# Patient Record
Sex: Male | Born: 1975 | Hispanic: Yes | Marital: Married | Smoking: Never smoker
Health system: Southern US, Community
[De-identification: ages and names within clinical notes are randomized; demographics above are authoritative.]

---

## 2013-03-29 ENCOUNTER — Emergency Department: Payer: Self-pay | Admitting: Emergency Medicine

## 2013-03-29 LAB — BASIC METABOLIC PANEL
ANION GAP: 6 — AB (ref 7–16)
BUN: 17 mg/dL (ref 7–18)
CREATININE: 1.06 mg/dL (ref 0.60–1.30)
Calcium, Total: 8.8 mg/dL (ref 8.5–10.1)
Chloride: 106 mmol/L (ref 98–107)
Co2: 29 mmol/L (ref 21–32)
GLUCOSE: 87 mg/dL (ref 65–99)
Osmolality: 282 (ref 275–301)
Potassium: 3.9 mmol/L (ref 3.5–5.1)
SODIUM: 141 mmol/L (ref 136–145)

## 2013-03-29 LAB — CBC
HCT: 42.1 % (ref 40.0–52.0)
HGB: 14.5 g/dL (ref 13.0–18.0)
MCH: 29.9 pg (ref 26.0–34.0)
MCHC: 34.4 g/dL (ref 32.0–36.0)
MCV: 87 fL (ref 80–100)
PLATELETS: 171 10*3/uL (ref 150–440)
RBC: 4.84 10*6/uL (ref 4.40–5.90)
RDW: 12.9 % (ref 11.5–14.5)
WBC: 9.4 10*3/uL (ref 3.8–10.6)

## 2013-03-29 LAB — TROPONIN I: Troponin-I: 0.03 ng/mL

## 2013-03-31 LAB — BETA STREP CULTURE(ARMC)

## 2013-11-04 ENCOUNTER — Emergency Department: Payer: Self-pay | Admitting: Emergency Medicine

## 2015-01-09 IMAGING — CR DG CHEST 2V
1 series · 2 of 2 positions shown · non-contrast
Comparison: None.

CLINICAL DATA: Chest pain.

EXAM:
CHEST  2 VIEW

[Series 1: pa · 0.17mm/px · 2 of 2 slices shown]
[im 1/2]
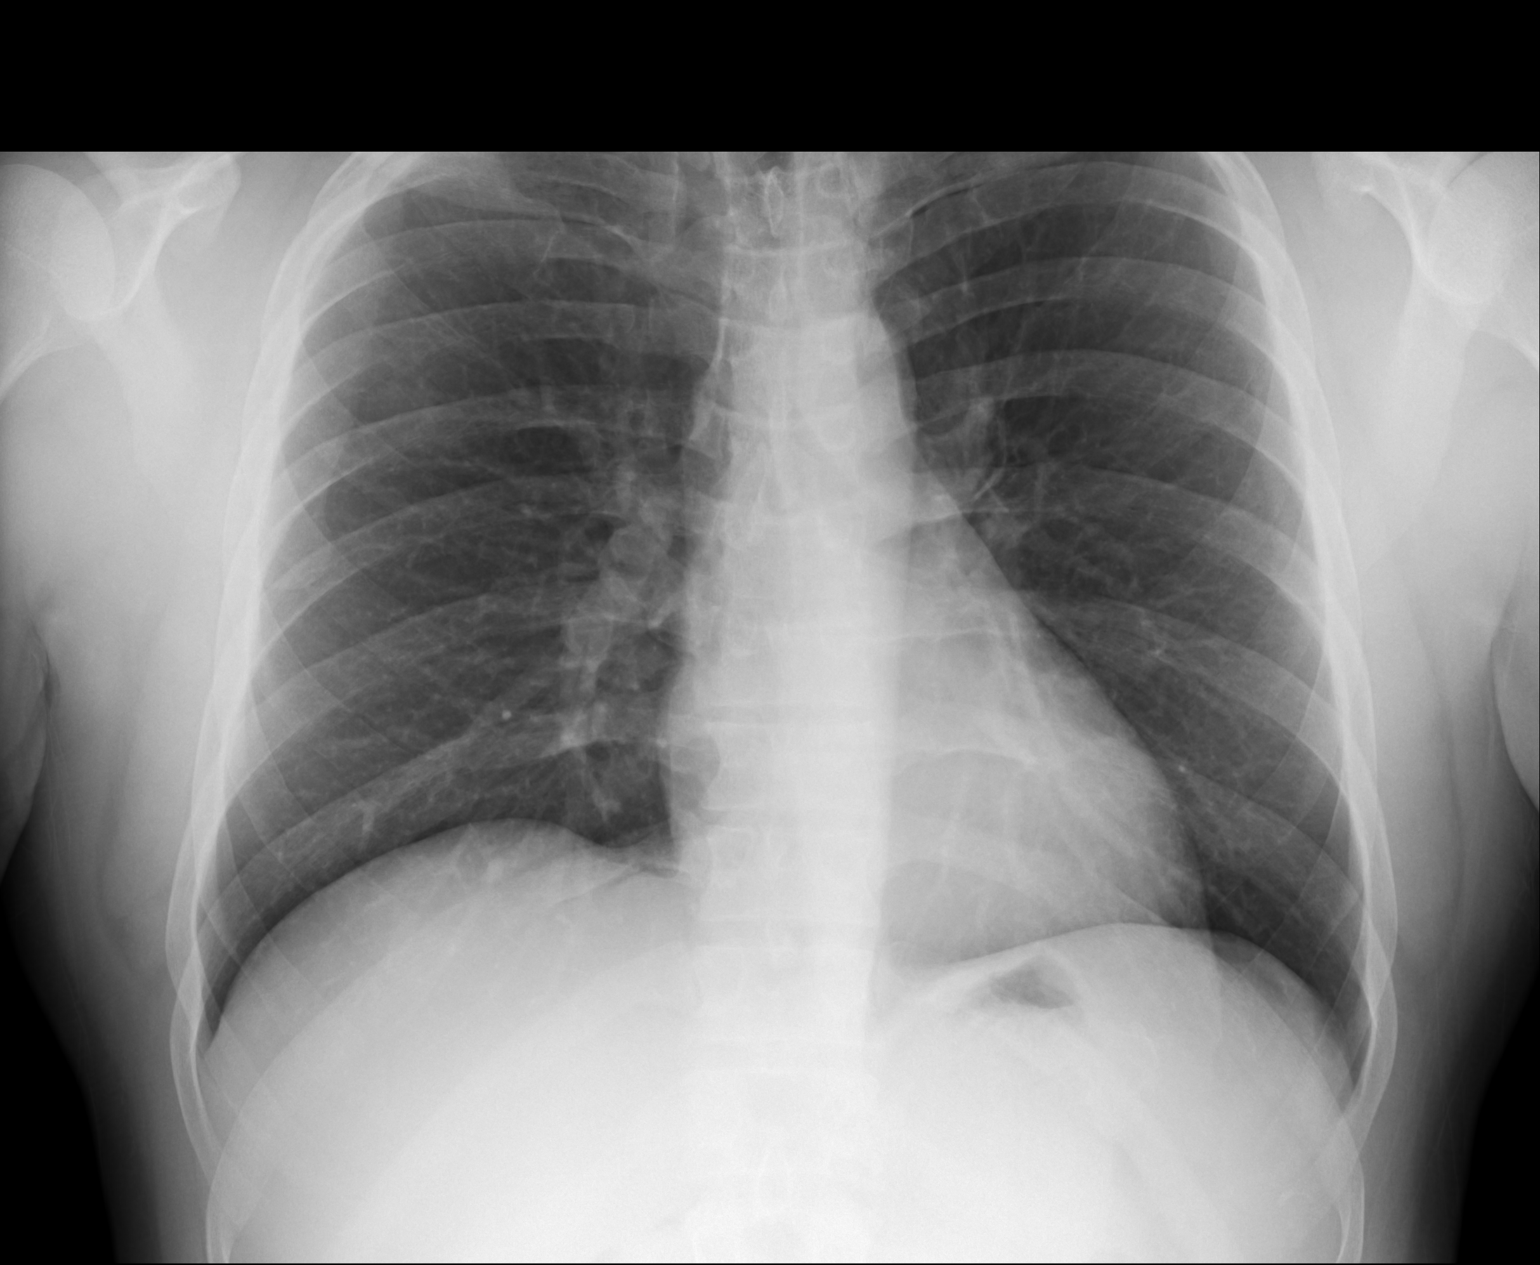
[im 2/2]
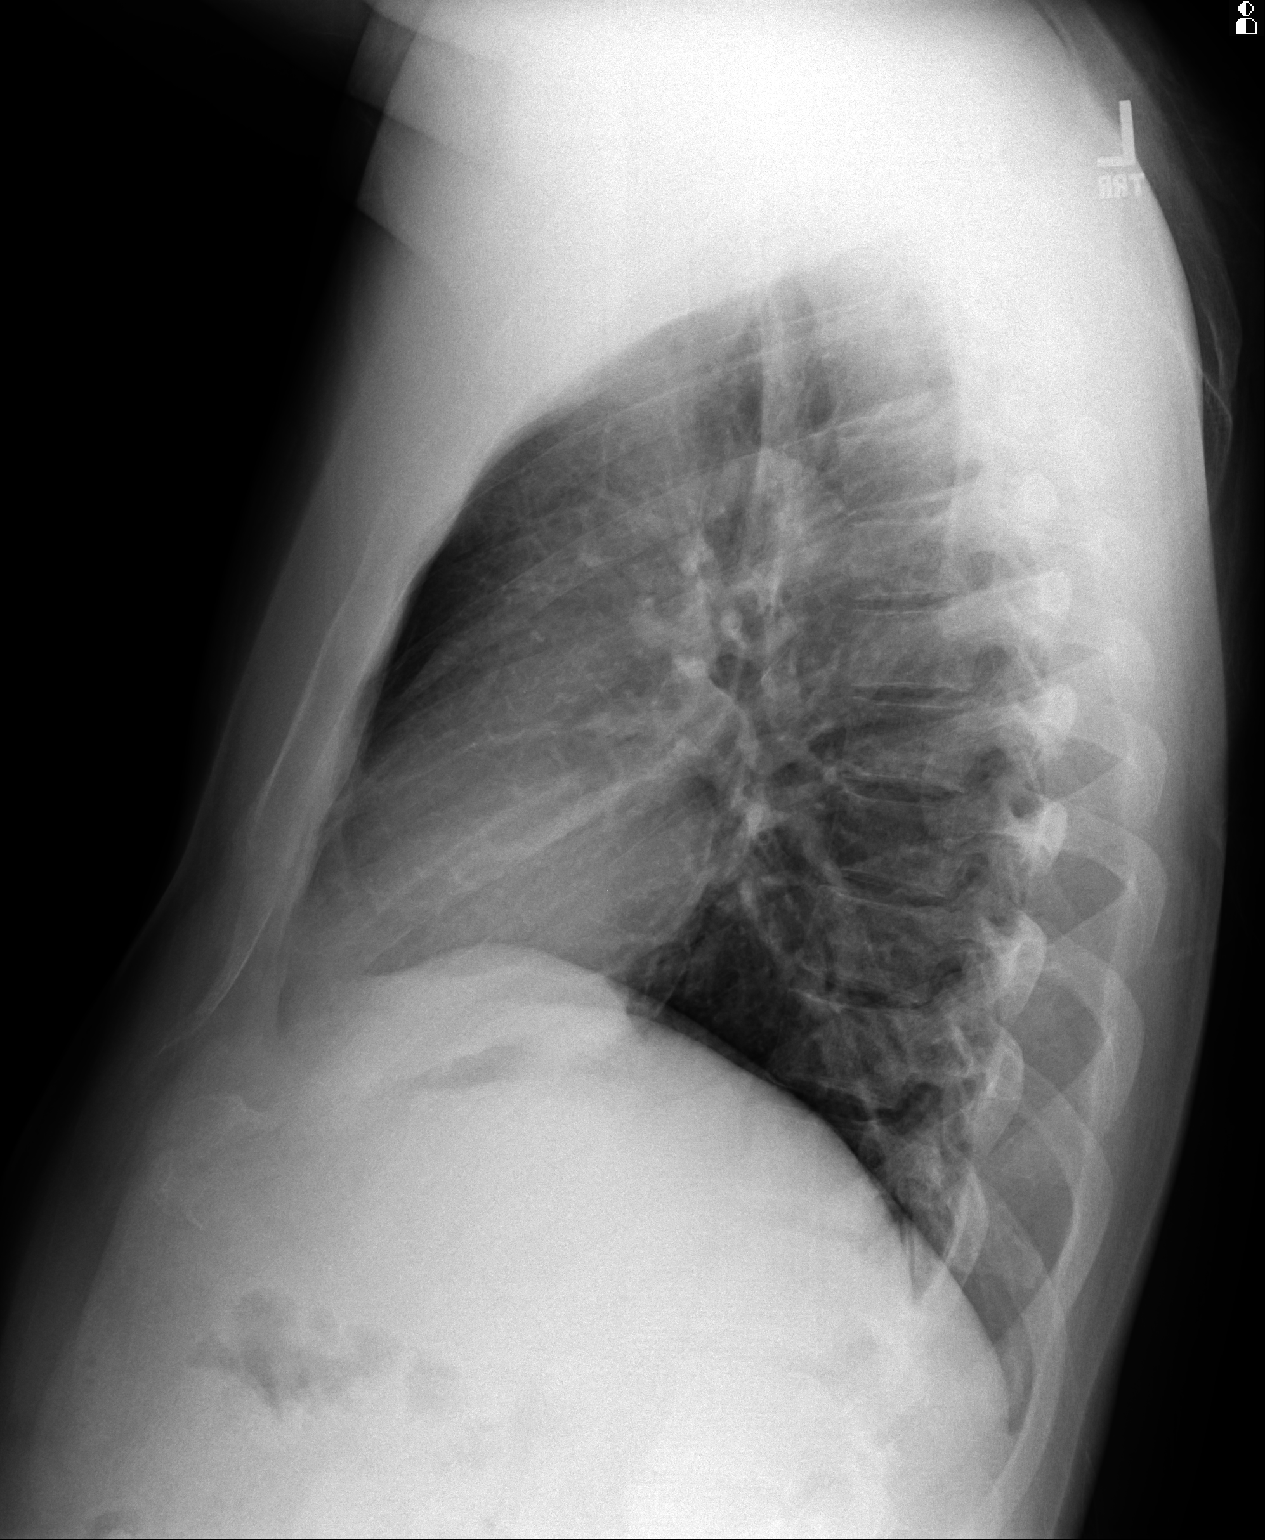

[2 of 2 positions shown; findings below may reference images not displayed]

FINDINGS: Lungs clear. Heart size normal. No pneumothorax or pleural fluid. No
focal bony abnormality.
IMPRESSION: No acute disease.

## 2021-12-20 ENCOUNTER — Emergency Department
Admission: EM | Admit: 2021-12-20 | Discharge: 2021-12-20 | Disposition: A | Discharge status: Home / Self Care | Payer: Self-pay | Attending: Emergency Medicine | Admitting: Emergency Medicine

## 2021-12-20 DIAGNOSIS — M545 Low back pain, unspecified: Secondary | ICD-10-CM | POA: Insufficient documentation

## 2021-12-20 MED ORDER — PREDNISONE 10 MG (21) PO TBPK: ORAL_TABLET | ORAL | 0 refills | Status: DC

## 2021-12-20 MED ORDER — BACLOFEN 10 MG PO TABS: 10.0000 mg | ORAL_TABLET | Freq: Three times a day (TID) | ORAL | 0 refills | Status: AC

## 2021-12-20 NOTE — ED Triage Notes (Signed)
Pt presents to the ED via POV due to back pain x2 weeks. Pt states he was brushing his teeth when he felt the initial sharp pain. Pt attempted pain management at home but unsuccessful. Pt states he is having difficulty completing his ADLs. Denies NVD. Pt A&Ox4

## 2021-12-20 NOTE — ED Provider Notes (Signed)
[facility] Provider Note    Event Date/Time   First MD Initiated Contact with Patient 12/20/21 1759     (approximate)   History   Back Pain   HPI  Douglas Logan is a 46 y.o. male with no significant past medical history presents emergency department with back pain for 2 weeks.  No numbness or tingling.  Patient states pain radiates from lower back around to the sides.  Worse with movement.  States he cannot bend over to tie his shoes.  No known injury.      Physical Exam   Triage Vital Signs: ED Triage Vitals  Enc Vitals Group     BP 12/20/21 1755 (!) 149/105     Pulse Rate 12/20/21 1755 74     Resp 12/20/21 1755 18     Temp 12/20/21 1755 98.2 F (36.8 C)     Temp Source 12/20/21 1755 Oral     SpO2 12/20/21 1755 96 %     Weight --      Height --      Head Circumference --      Peak Flow --      Pain Score 12/20/21 1751 8     Pain Loc --      Pain Edu? --      Excl. in GC? --     Most recent vital signs: Vitals:   12/20/21 1755  BP: (!) 149/105  Pulse: 74  Resp: 18  Temp: 98.2 F (36.8 C)  SpO2: 96%     General: Awake, no distress.   CV:  Good peripheral perfusion. regular rate and  rhythm Resp:  Normal effort.  Abd:  No distention.   Other:  Patient has reproduced pain with forward flexion and hyperextension, does walk slowly, able to stand on toes and heels but does reproduce pain, does not have foot drop   ED Results / Procedures / Treatments   Labs (all labs ordered are listed, but only abnormal results are displayed) Labs Reviewed - No data to display   EKG     RADIOLOGY     PROCEDURES:   Procedures   MEDICATIONS ORDERED IN ED: Medications - No data to display   IMPRESSION / MDM / ASSESSMENT AND PLAN / ED COURSE  I reviewed the triage vital signs and the nursing notes.                              Differential diagnosis includes, but is not limited to, muscle strain, sciatica, cauda  equina  Patient's presentation is most consistent with acute, uncomplicated illness.   Patient's complaints do not match cauda equina symptoms.  So feel this is less likely.  Started the patient on a prescription for Sterapred Dosepak, baclofen.  He is to follow-up with his regular doctor if not improving to 3 days.  Return emergency department worsening.  Follow with orthopedics if not better in 1 week.  We did discuss over-the-counter measures.  He was discharged stable condition.      FINAL CLINICAL IMPRESSION(S) / ED DIAGNOSES   Final diagnoses:  Acute midline low back pain without sciatica     Rx / DC Orders   ED Discharge Orders          Ordered    predniSONE (STERAPRED UNI-PAK 21 TAB) 10 MG (21) TBPK tablet        12/20/21 1805    baclofen (  LIORESAL) 10 MG tablet  3 times daily        12/20/21 1805             Note:  This document was prepared using Dragon voice recognition software and may include unintentional dictation errors.    Faythe Ghee, PA-C 12/20/21 Leveda Anna, MD 12/20/21 [phone]

## 2021-12-28 ENCOUNTER — Encounter: Payer: Self-pay | Admitting: *Deleted

## 2021-12-28 ENCOUNTER — Other Ambulatory Visit: Payer: Self-pay

## 2021-12-28 ENCOUNTER — Emergency Department: Payer: Self-pay

## 2021-12-28 ENCOUNTER — Emergency Department
Admission: EM | Admit: 2021-12-28 | Discharge: 2021-12-28 | Disposition: A | Discharge status: Home / Self Care | Payer: Self-pay | Attending: Student in an Organized Health Care Education/Training Program | Admitting: Student in an Organized Health Care Education/Training Program

## 2021-12-28 DIAGNOSIS — M545 Low back pain, unspecified: Secondary | ICD-10-CM | POA: Insufficient documentation

## 2021-12-28 LAB — URINALYSIS, ROUTINE W REFLEX MICROSCOPIC
Bilirubin Urine: NEGATIVE
Glucose, UA: NEGATIVE mg/dL
Hgb urine dipstick: NEGATIVE
Ketones, ur: NEGATIVE mg/dL
Leukocytes,Ua: NEGATIVE
Nitrite: NEGATIVE
Protein, ur: NEGATIVE mg/dL
Specific Gravity, Urine: 1.021 (ref 1.005–1.030)
pH: 6 (ref 5.0–8.0)

## 2021-12-28 MED ORDER — OXYCODONE HCL 5 MG PO TABS
5.0000 mg | ORAL_TABLET | Freq: Once | ORAL | Status: AC
  Administered 2021-12-28: 5 mg via ORAL
  Filled 2021-12-28: qty 1

## 2021-12-28 MED ORDER — HYDROCODONE-ACETAMINOPHEN 5-325 MG PO TABS: 1.0000 | ORAL_TABLET | Freq: Four times a day (QID) | ORAL | 0 refills | Status: AC | PRN

## 2021-12-28 MED ORDER — KETOROLAC TROMETHAMINE 60 MG/2ML IM SOLN
30.0000 mg | Freq: Once | INTRAMUSCULAR | Status: AC
  Administered 2021-12-28: 30 mg via INTRAMUSCULAR
  Filled 2021-12-28: qty 2

## 2021-12-28 MED ORDER — MELOXICAM 15 MG PO TABS: 15.0000 mg | ORAL_TABLET | Freq: Every day | ORAL | 2 refills | Status: AC

## 2021-12-28 NOTE — ED Notes (Signed)
See triage note  Presents with lower back pain which has been there for about 3 weeks  Pain increases with bending   No n/v  or urinary sxs

## 2021-12-28 NOTE — Discharge Instructions (Signed)
Please be aware that the hydrocodone (Norco) prescribed may cause dizziness or drowsiness.  You should not drive or operate machinery while taking this medication.  Follow-up with orthopedics or primary care if symptoms or not improving over the next few days.  Return to the emergency department for symptoms of change or worsen if you are unable to schedule an appointment.

## 2021-12-28 NOTE — ED Provider Triage Note (Signed)
Emergency Medicine Provider Triage Evaluation Note  Leanthony Rhett , a 46 y.o. male  was evaluated in triage.  Pt complains of persistent low back pain.  Patient was previously evaluated on 12/20/2021 and was discharged with prednisone and baclofen.  Patient states that he has taken all of his prednisone as directed with little improvement in his symptoms.  Patient is able to ambulate.  No bowel or bladder incontinence or saddle anesthesia.  Review of Systems  Positive: Patient has low back pain.  Negative: No chest pain or abdominal pain.   Physical Exam  BP (!) 150/111 (BP Location: Left Arm)   Pulse 72   Temp 98.2 F (36.8 C) (Oral)   Resp 18   Ht 5\' 8"  (1.727 m)   Wt 83.9 kg   SpO2 99%   BMI 28.13 kg/m  Gen:   Awake, no distress   Resp:  Normal effort  MSK:   Moves extremities without difficulty  Other:    Medical Decision Making  Medically screening exam initiated at 4:53 PM.  Appropriate orders placed.  Abdiaziz Klahn was informed that the remainder of the evaluation will be completed by another provider, this initial triage assessment does not replace that evaluation, and the importance of remaining in the ED until their evaluation is complete.     Gurney Maxin Buckhorn, [place] 12/28/21 1654

## 2021-12-28 NOTE — ED Triage Notes (Signed)
Pt ambulatory to triage.  Pt has back pain.  Pt seen recently for similar sx.  Pt alert.

## 2021-12-28 NOTE — ED Provider Notes (Signed)
[facility] Provider Note    Event Date/Time   First MD Initiated Contact with Patient 12/28/21 1814     (approximate)   History   Back Pain   HPI  Javontae Marlette is a 46 y.o. male with no significant past medical history presents to the emergency department for treatment and evaluation of nontraumatic lower back pain.  Pain is mainly in the right lower back but travels across to the left occasionally.  He also states that the pain radiates down into his right hip.  He was evaluated here and given muscle relaxer and steroid.  He had no improvement after finishing these medications.  He denies having had a fever, chronic steroid use, IV drug use, pain acutely worse at night, or loss of control of bowel or bladder.     Physical Exam   Triage Vital Signs: ED Triage Vitals  Enc Vitals Group     BP 12/28/21 1649 (!) 150/111     Pulse Rate 12/28/21 1649 72     Resp 12/28/21 1649 18     Temp 12/28/21 1649 98.2 F (36.8 C)     Temp Source 12/28/21 1649 Oral     SpO2 12/28/21 1649 99 %     Weight 12/28/21 1651 185 lb (83.9 kg)     Height 12/28/21 1651 5\' 8"  (1.727 m)     Head Circumference --      Peak Flow --      Pain Score 12/28/21 1649 10     Pain Loc --      Pain Edu? --      Excl. in GC? --     Most recent vital signs: Vitals:   12/28/21 1649  BP: (!) 150/111  Pulse: 72  Resp: 18  Temp: 98.2 F (36.8 C)  SpO2: 99%     General: Awake, no distress.  CV:  Good peripheral perfusion.  Resp:  Normal effort.  Abd:  No distention.  Other:  No focal midline tenderness of the lumbar spine.  Straight leg test is negative.   ED Results / Procedures / Treatments   Labs (all labs ordered are listed, but only abnormal results are displayed) Labs Reviewed  URINALYSIS, ROUTINE W REFLEX MICROSCOPIC - Abnormal; Notable for the following components:      Result Value   Color, Urine YELLOW (*)    APPearance CLEAR (*)    All other components  within normal limits     EKG  Not indicated   RADIOLOGY  No acute bony abnormality noted in image of the lumbar spine.  Image reviewed and interpreted by me.  Radiology report consistent with the same.   PROCEDURES:  Critical Care performed: No  Procedures   MEDICATIONS ORDERED IN ED: Medications  ketorolac (TORADOL) injection 30 mg (30 mg Intramuscular Given 12/28/21 1831)  oxyCODONE (Oxy IR/ROXICODONE) immediate release tablet 5 mg (5 mg Oral Given 12/28/21 1831)     IMPRESSION / MDM / ASSESSMENT AND PLAN / ED COURSE  I reviewed the triage vital signs and the nursing notes.                              Differential diagnosis includes, but is not limited to, degenerative disc disease, herniated disc, lumbar radiculopathy, musculoskeletal strain  Patient's presentation is most consistent with acute complicated illness / injury requiring diagnostic workup.  46 year old male presenting to the emergency department for treatment  and evaluation of ongoing low back pain.  See HPI for further details.  Imaging is negative for any bony abnormality.  Urinalysis does not show any evidence of blood or infection.  IM Toradol and p.o. Roxicodone ordered.  Results discussed with the patient.  Plan will be to have him follow-up with orthopedics.  He was advised to stop taking the baclofen.  He will be prescribed Norco for a few days and meloxicam to be taken daily.  He is to return to the emergency department for symptoms of change or worsen if he is unable to see primary care or his orthopedist.     FINAL CLINICAL IMPRESSION(S) / ED DIAGNOSES   Final diagnoses:  Acute right-sided low back pain without sciatica     Rx / DC Orders   ED Discharge Orders          Ordered    HYDROcodone-acetaminophen (NORCO/VICODIN) 5-325 MG tablet  Every 6 hours PRN        12/28/21 1909    meloxicam (MOBIC) 15 MG tablet  Daily        12/28/21 1909             Note:  This document  was prepared using Dragon voice recognition software and may include unintentional dictation errors.   Chinita Pester, FNP 12/28/21 1911    Willy Eddy, MD 12/28/21 2035

## 2021-12-28 NOTE — ED Notes (Signed)
Pt Dc to home. Dc instructions reviewed with all questions answered. Pt verbalizes understanding. Pt ambulatory out of dept with steady gait with family member

## 2023-03-12 ENCOUNTER — Other Ambulatory Visit: Payer: Self-pay

## 2023-03-12 ENCOUNTER — Emergency Department
Admission: EM | Admit: 2023-03-12 | Discharge: 2023-03-12 | Disposition: A | Discharge status: Home / Self Care | Payer: Self-pay | Attending: Emergency Medicine | Admitting: Emergency Medicine

## 2023-03-12 DIAGNOSIS — J069 Acute upper respiratory infection, unspecified: Secondary | ICD-10-CM | POA: Insufficient documentation

## 2023-03-12 DIAGNOSIS — Z20822 Contact with and (suspected) exposure to covid-19: Secondary | ICD-10-CM | POA: Insufficient documentation

## 2023-03-12 LAB — RESP PANEL BY RT-PCR (RSV, FLU A&B, COVID)  RVPGX2
Influenza A by PCR: NEGATIVE
Influenza B by PCR: NEGATIVE
Resp Syncytial Virus by PCR: NEGATIVE
SARS Coronavirus 2 by RT PCR: NEGATIVE

## 2023-03-12 MED ORDER — GUAIFENESIN-CODEINE 100-10 MG/5ML PO SOLN: 5.0000 mL | Freq: Four times a day (QID) | ORAL | 0 refills | Status: AC | PRN

## 2023-03-12 NOTE — ED Triage Notes (Signed)
 C/O painful productive cough. Phlegm green and white.  Denies fevers.  AAOx3.  Skin warm and dry. NAD

## 2023-03-12 NOTE — ED Provider Notes (Signed)
 [facility] Provider Note    Event Date/Time   First MD Initiated Contact with Patient 03/12/23 1031     (approximate)   History   Cough   HPI  Douglas Logan is a 48 y.o. male   presents to the ED with complaint of productive cough for several days but denies fever, chills, nausea or vomiting.  Patient has been taking some Tylenol  Cold and flu without any relief.      Physical Exam   Triage Vital Signs: ED Triage Vitals  Encounter Vitals Group     BP 03/12/23 0925 (!) 162/107     Systolic BP Percentile --      Diastolic BP Percentile --      Pulse Rate 03/12/23 0925 81     Resp 03/12/23 0925 16     Temp 03/12/23 0925 98.1 F (36.7 C)     Temp Source 03/12/23 0925 Oral     SpO2 03/12/23 0925 93 %     Weight 03/12/23 0924 184 lb 15.5 oz (83.9 kg)     Height --      Head Circumference --      Peak Flow --      Pain Score 03/12/23 0924 0     Pain Loc --      Pain Education --      Exclude from Growth Chart --     Most recent vital signs: Vitals:   03/12/23 0925  BP: (!) 162/107  Pulse: 81  Resp: 16  Temp: 98.1 F (36.7 C)  SpO2: 93%     General: Awake, no distress.  Alert, talkative.  Able to speak in complete sentences without difficulty. CV:  Good peripheral perfusion.  Heart regular rate and rhythm. Resp:  Normal effort.  Lungs are clear bilaterally. Abd:  No distention.  Other:     ED Results / Procedures / Treatments   Labs (all labs ordered are listed, but only abnormal results are displayed) Labs Reviewed  RESP PANEL BY RT-PCR (RSV, FLU A&B, COVID)  [id number]      PROCEDURES:  Critical Care performed:   Procedures   MEDICATIONS ORDERED IN ED: Medications - No data to display   IMPRESSION / MDM / ASSESSMENT AND PLAN / ED COURSE  I reviewed the triage vital signs and the nursing notes.   Differential diagnosis includes, but is not limited to, COVID, influenza, RSV, bronchitis, pneumonia, upper  respiratory infection.  48 year old male presents to the ED with complaint of productive cough that has been going on for several days without relief of his over-the-counter medication.  Patient was made aware that his respiratory panel was negative and reassured.  A prescription for Robitussin AC was sent to the pharmacy for him to take.  He is aware that this contains a narcotic and should not be be taken if he plans on driving or operating machinery.      Patient's presentation is most consistent with acute complicated illness / injury requiring diagnostic workup.  FINAL CLINICAL IMPRESSION(S) / ED DIAGNOSES   Final diagnoses:  Viral URI with cough     Rx / DC Orders   ED Discharge Orders          Ordered    guaiFENesin -codeine  100-10 MG/5ML syrup  Every 6 hours PRN        03/12/23 1057             Note:  This document was prepared using Dragon voice recognition  software and may include unintentional dictation errors.   Saunders Shona CROME, PA-C 03/12/23 1105    Bradler, Evan K, MD 03/12/23 [phone]

## 2023-03-12 NOTE — Discharge Instructions (Addendum)
 Follow-up with your primary care provider or [place] urgent care if any continued problems.  A prescription for guaifenesin  with codeine  was sent to the pharmacy for you to take every 6 hours as needed for cough.  Do not drive or operate machinery while taking this medication as it could cause drowsiness.  Tylenol  or ibuprofen if needed for body aches.  Continue taking your regular medication.  Also your blood pressure was elevated while you are in the emergency department and should be rechecked by your primary care provider.
